# Patient Record
Sex: Female | Born: 1950 | Race: Black or African American | Hispanic: No | Smoking: Current some day smoker
Health system: Southern US, Community
[De-identification: ages and names within clinical notes are randomized; demographics above are authoritative.]

## PROBLEM LIST (undated history)

## (undated) DIAGNOSIS — M1990 Unspecified osteoarthritis, unspecified site: Secondary | ICD-10-CM

## (undated) DIAGNOSIS — N189 Chronic kidney disease, unspecified: Secondary | ICD-10-CM

## (undated) DIAGNOSIS — I509 Heart failure, unspecified: Secondary | ICD-10-CM

## (undated) DIAGNOSIS — E785 Hyperlipidemia, unspecified: Secondary | ICD-10-CM

## (undated) DIAGNOSIS — I10 Essential (primary) hypertension: Secondary | ICD-10-CM

## (undated) HISTORY — DX: Heart failure, unspecified: I50.9

## (undated) HISTORY — DX: Unspecified osteoarthritis, unspecified site: M19.90

## (undated) HISTORY — DX: Hyperlipidemia, unspecified: E78.5

## (undated) HISTORY — DX: Chronic kidney disease, unspecified: N18.9

## (undated) HISTORY — PX: TUBAL LIGATION: SHX77

## (undated) HISTORY — PX: KNEE ARTHROCENTESIS: SUR44

---

## 1989-04-06 HISTORY — PX: KNEE ARTHROCENTESIS: SUR44

## 2002-02-19 ENCOUNTER — Emergency Department (HOSPITAL_COMMUNITY): Admission: EM | Admit: 2002-02-19 | Discharge: 2002-02-19 | Payer: Self-pay | Admitting: Emergency Medicine

## 2002-02-19 ENCOUNTER — Encounter: Payer: Self-pay | Admitting: Emergency Medicine

## 2006-05-04 ENCOUNTER — Ambulatory Visit: Payer: Self-pay | Admitting: Internal Medicine

## 2006-05-05 ENCOUNTER — Ambulatory Visit: Payer: Self-pay | Admitting: *Deleted

## 2006-06-22 ENCOUNTER — Ambulatory Visit: Payer: Self-pay | Admitting: Internal Medicine

## 2006-06-22 ENCOUNTER — Encounter: Payer: Self-pay | Admitting: Internal Medicine

## 2006-07-12 ENCOUNTER — Ambulatory Visit (HOSPITAL_COMMUNITY): Admission: RE | Admit: 2006-07-12 | Discharge: 2006-07-12 | Payer: Self-pay | Admitting: Family Medicine

## 2006-09-02 ENCOUNTER — Ambulatory Visit: Payer: Self-pay | Admitting: Internal Medicine

## 2006-12-22 ENCOUNTER — Encounter (INDEPENDENT_AMBULATORY_CARE_PROVIDER_SITE_OTHER): Payer: Self-pay | Admitting: *Deleted

## 2007-02-16 ENCOUNTER — Ambulatory Visit: Payer: Self-pay | Admitting: Internal Medicine

## 2017-09-09 ENCOUNTER — Other Ambulatory Visit: Payer: Self-pay

## 2017-09-09 ENCOUNTER — Emergency Department (HOSPITAL_COMMUNITY)
Admission: EM | Admit: 2017-09-09 | Discharge: 2017-09-10 | Disposition: A | Discharge status: Home / Self Care | Payer: Self-pay | Attending: Emergency Medicine | Admitting: Emergency Medicine

## 2017-09-09 ENCOUNTER — Encounter (HOSPITAL_COMMUNITY): Payer: Self-pay | Admitting: Emergency Medicine

## 2017-09-09 DIAGNOSIS — K0889 Other specified disorders of teeth and supporting structures: Secondary | ICD-10-CM | POA: Insufficient documentation

## 2017-09-09 DIAGNOSIS — F17210 Nicotine dependence, cigarettes, uncomplicated: Secondary | ICD-10-CM | POA: Insufficient documentation

## 2017-09-09 DIAGNOSIS — I10 Essential (primary) hypertension: Secondary | ICD-10-CM | POA: Insufficient documentation

## 2017-09-09 HISTORY — DX: Essential (primary) hypertension: I10

## 2017-09-09 NOTE — ED Triage Notes (Signed)
Pt from home with c/o dental pain that has caused her to be hypertensive and have a headache. Pt states dental pain began in May. Pt went in May to have a tooth taken out, but was told they could not do so until her bp was lower. Pt began taking bp medication and antibiotics at that time. Pt states her pain medication ran out and she now feels like bp and dental pain are worse than before.

## 2017-09-10 MED ORDER — MELOXICAM 7.5 MG PO TABS: ORAL_TABLET | ORAL | 0 refills | Status: DC

## 2017-09-10 MED ORDER — AMOXICILLIN 500 MG PO CAPS: 500.0000 mg | ORAL_CAPSULE | Freq: Three times a day (TID) | ORAL | 0 refills | Status: DC

## 2017-09-10 MED ORDER — AMLODIPINE BESYLATE 5 MG PO TABS: 5.0000 mg | ORAL_TABLET | Freq: Every day | ORAL | 0 refills | Status: AC

## 2017-09-10 NOTE — ED Provider Notes (Signed)
[facility] Provider Note: [name], MD, FACEP  CSN: [id number] MRN: [id number] ARRIVAL: 09/09/17 at 2100 ROOM: WA18/WA18   CHIEF COMPLAINT  Dental Pain   HISTORY OF PRESENT ILLNESS  09/10/17 12:10 AM Dawn Davis is a 67 y.o. female who has had pain in her left lower first molar for about the past month.  She was previously placed on amoxicillin and meloxicam which helped her pain.  She went to a dentist to have definitive treatment but the dentist refused to treat her due to elevated blood pressure.  She has subsequently been placed on lisinopril.  She has been out of her amoxicillin and meloxicam for about 2 weeks.  The pain in her tooth worsened 2 days ago and she now rates it as a 9.5 out of 10, worse with eating or drinking.  She is also complaining of a headache.  She was noted to be hypertensive on arrival though she states she continues to take her lisinopril.   Past Medical History:  Diagnosis Date  . Hypertension     Past Surgical History:  Procedure Laterality Date  . KNEE ARTHROCENTESIS    . TUBAL LIGATION      No family history on file.  Social History   Tobacco Use  . Smoking status: Current Some Day Smoker  . Smokeless tobacco: Never Used  Substance Use Topics  . Alcohol use: Yes    Comment: once a week  . Drug use: Never    Prior to Admission medications   Not on File    Allergies Shellfish allergy   REVIEW OF SYSTEMS  Negative except as noted here or in the History of Present Illness.   PHYSICAL EXAMINATION  Initial Vital Signs Blood pressure (!) 185/119, pulse 86, temperature 98.4 F (36.9 C), temperature source Oral, resp. rate 16, height 5\' 1"  (1.549 m), weight 68 kg (150 lb), SpO2 98 %.  Examination General: Well-developed, well-nourished female in no acute distress; appearance consistent with age of record HENT: normocephalic; atraumatic; left lower first molar with multiple fillings and tenderness to percussion Eyes:  pupils equal, round and reactive to light; extraocular muscles intact Neck: supple; left anterior cervical lymphadenopathy Heart: regular rate and rhythm Lungs: clear to auscultation bilaterally Abdomen: soft; nondistended; nontender; bowel sounds present Extremities: No deformity; full range of motion Neurologic: Awake, alert and oriented; motor function intact in all extremities and symmetric; no facial droop Skin: Warm and dry Psychiatric: Flat affect   RESULTS  Summary of this visit's results, reviewed by myself:   EKG Interpretation  Date/Time:    Ventricular Rate:    PR Interval:    QRS Duration:   QT Interval:    QTC Calculation:   R Axis:     Text Interpretation:        Laboratory Studies: No results found for this or any previous visit (from the past 24 hour(s)). Imaging Studies: No results found.  ED COURSE and MDM  Nursing notes and initial vitals signs, including pulse oximetry, reviewed.  Vitals:   09/09/17 2147  BP: (!) 185/119  Pulse: 86  Resp: 16  Temp: 98.4 F (36.9 C)  TempSrc: Oral  SpO2: 98%  Weight: 68 kg (150 lb)  Height: 5\' 1"  (1.549 m)   We will refill the patient's amoxicillin and meloxicam.  She is already on lisinopril with poorly controlled blood pressure we will add Norvasc and have her follow back up with her dentist.  We do not have a dentist  on call from the emergency department currently.  PROCEDURES    ED DIAGNOSES     ICD-10-CM   1. Pain, dental K08.89   2. Hypertension not at goal I10        Loida Calamia, Jonny RuizJohn, MD 09/10/17 [phone]

## 2018-11-15 ENCOUNTER — Other Ambulatory Visit: Payer: Self-pay | Admitting: Family

## 2018-11-15 DIAGNOSIS — Z1231 Encounter for screening mammogram for malignant neoplasm of breast: Secondary | ICD-10-CM

## 2018-11-16 ENCOUNTER — Other Ambulatory Visit: Payer: Self-pay | Admitting: Family

## 2018-11-16 DIAGNOSIS — Z13820 Encounter for screening for osteoporosis: Secondary | ICD-10-CM

## 2019-01-23 ENCOUNTER — Ambulatory Visit
Admission: RE | Admit: 2019-01-23 | Discharge: 2019-01-23 | Disposition: A | Discharge status: Home / Self Care | Payer: Medicare Other | Source: Ambulatory Visit | Attending: Family | Admitting: Family

## 2019-01-23 ENCOUNTER — Other Ambulatory Visit: Payer: Self-pay

## 2019-01-23 DIAGNOSIS — Z1231 Encounter for screening mammogram for malignant neoplasm of breast: Secondary | ICD-10-CM

## 2019-01-23 DIAGNOSIS — Z20822 Contact with and (suspected) exposure to covid-19: Secondary | ICD-10-CM

## 2019-01-23 DIAGNOSIS — Z13820 Encounter for screening for osteoporosis: Secondary | ICD-10-CM

## 2019-01-25 LAB — NOVEL CORONAVIRUS, NAA: SARS-CoV-2, NAA: NOT DETECTED

## 2019-02-15 ENCOUNTER — Ambulatory Visit
Admission: RE | Admit: 2019-02-15 | Discharge: 2019-02-15 | Disposition: A | Discharge status: Home / Self Care | Payer: Medicare Other | Source: Ambulatory Visit | Attending: Family | Admitting: Family

## 2019-02-15 ENCOUNTER — Other Ambulatory Visit: Payer: Self-pay | Admitting: Family

## 2019-02-15 DIAGNOSIS — M25562 Pain in left knee: Secondary | ICD-10-CM

## 2019-05-18 ENCOUNTER — Other Ambulatory Visit: Payer: Self-pay | Admitting: Family

## 2019-05-18 ENCOUNTER — Other Ambulatory Visit (HOSPITAL_COMMUNITY): Payer: Self-pay | Admitting: Family

## 2019-05-18 DIAGNOSIS — M79644 Pain in right finger(s): Secondary | ICD-10-CM | POA: Diagnosis not present

## 2019-05-18 DIAGNOSIS — J309 Allergic rhinitis, unspecified: Secondary | ICD-10-CM | POA: Diagnosis not present

## 2019-05-18 DIAGNOSIS — I10 Essential (primary) hypertension: Secondary | ICD-10-CM | POA: Diagnosis not present

## 2019-05-18 DIAGNOSIS — M25562 Pain in left knee: Secondary | ICD-10-CM | POA: Diagnosis not present

## 2019-06-01 ENCOUNTER — Other Ambulatory Visit: Payer: Self-pay

## 2019-06-01 ENCOUNTER — Encounter (HOSPITAL_COMMUNITY): Payer: Self-pay

## 2019-06-01 ENCOUNTER — Ambulatory Visit (HOSPITAL_COMMUNITY)
Admission: RE | Admit: 2019-06-01 | Discharge: 2019-06-01 | Disposition: A | Discharge status: Home / Self Care | Payer: Medicare Other | Source: Ambulatory Visit | Attending: Family | Admitting: Family

## 2019-06-01 DIAGNOSIS — M25562 Pain in left knee: Secondary | ICD-10-CM

## 2019-07-03 DIAGNOSIS — Z0001 Encounter for general adult medical examination with abnormal findings: Secondary | ICD-10-CM | POA: Diagnosis not present

## 2019-07-03 DIAGNOSIS — Z79899 Other long term (current) drug therapy: Secondary | ICD-10-CM | POA: Diagnosis not present

## 2019-08-16 DIAGNOSIS — H40053 Ocular hypertension, bilateral: Secondary | ICD-10-CM | POA: Diagnosis not present

## 2019-08-16 DIAGNOSIS — H40013 Open angle with borderline findings, low risk, bilateral: Secondary | ICD-10-CM | POA: Diagnosis not present

## 2019-08-16 DIAGNOSIS — H04123 Dry eye syndrome of bilateral lacrimal glands: Secondary | ICD-10-CM | POA: Diagnosis not present

## 2019-08-16 DIAGNOSIS — H2513 Age-related nuclear cataract, bilateral: Secondary | ICD-10-CM | POA: Diagnosis not present

## 2019-10-02 DIAGNOSIS — I10 Essential (primary) hypertension: Secondary | ICD-10-CM | POA: Diagnosis not present

## 2019-10-02 DIAGNOSIS — R2681 Unsteadiness on feet: Secondary | ICD-10-CM | POA: Diagnosis not present

## 2019-10-31 DIAGNOSIS — Z1211 Encounter for screening for malignant neoplasm of colon: Secondary | ICD-10-CM | POA: Diagnosis not present

## 2021-01-10 ENCOUNTER — Other Ambulatory Visit: Payer: Self-pay | Admitting: Family

## 2021-01-10 DIAGNOSIS — Z1231 Encounter for screening mammogram for malignant neoplasm of breast: Secondary | ICD-10-CM

## 2021-03-10 ENCOUNTER — Ambulatory Visit
Admission: RE | Admit: 2021-03-10 | Discharge: 2021-03-10 | Disposition: A | Discharge status: Home / Self Care | Payer: Medicare Other | Source: Ambulatory Visit | Attending: Family | Admitting: Family

## 2021-03-10 DIAGNOSIS — Z1231 Encounter for screening mammogram for malignant neoplasm of breast: Secondary | ICD-10-CM

## 2021-11-13 IMAGING — MR MR KNEE*L* W/O CM
7 series · 39 of 40 positions shown · non-contrast
Comparison: None.

CLINICAL DATA: Left knee pain for 2 years.

EXAM:
MRI OF THE LEFT KNEE WITHOUT CONTRAST
TECHNIQUE: Multiplanar, multisequence MR imaging of the knee was performed. No
intravenous contrast was administered.

[Series 5: PD fat-sat · sagittal · left · 4.0mm · 0.47mm/px · 5 of 18 slices shown (1 of 2)]
[im 1/18]
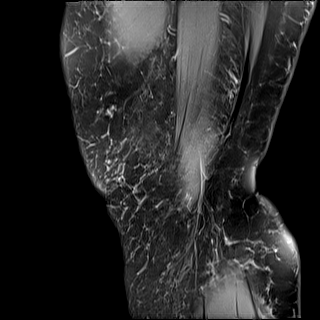
[im 5/18]
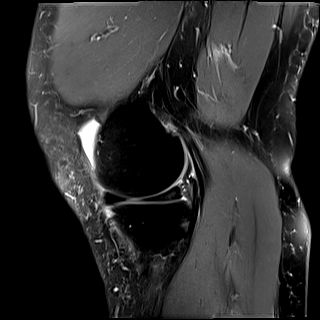
[im 9/18]
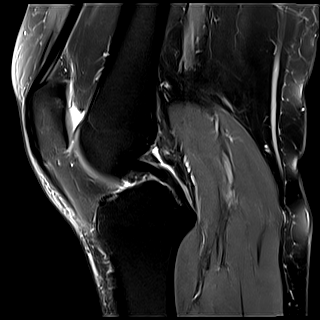
[im 13/18]
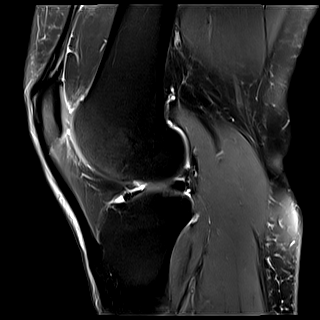
[im 18/18]
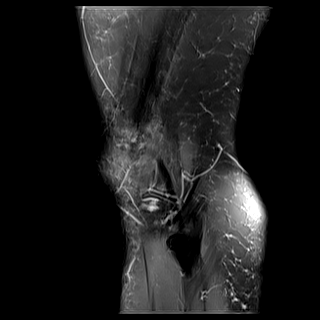

[Series 6: T2 fat-sat · sagittal · left · 4.0mm · 0.47mm/px · 5 of 18 slices shown (1 of 3)]
[im 1/18]
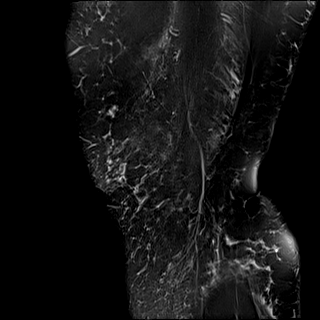
[im 5/18]
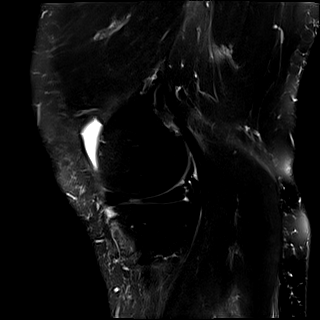
[im 9/18]
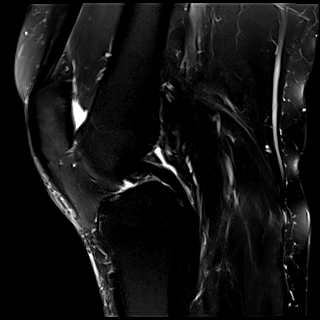
[im 13/18]
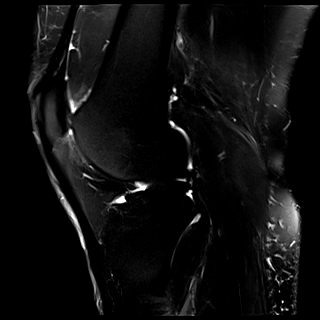
[im 18/18]
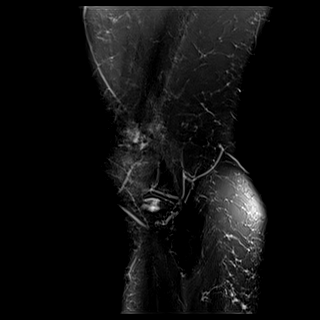

[Series 7: T1 · coronal · left · 4.0mm · 0.29mm/px · 5 of 25 slices shown]
[im 1/25]
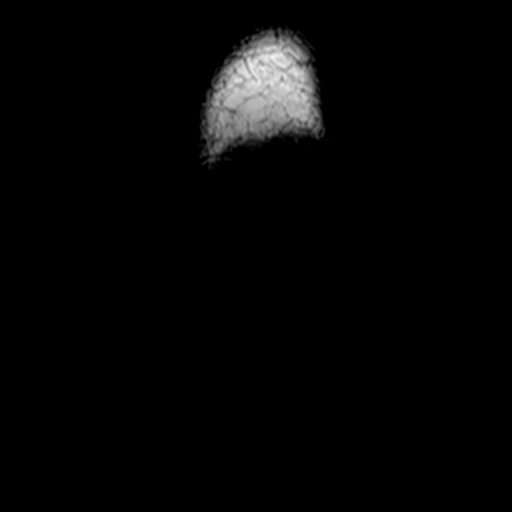
[im 5/25]
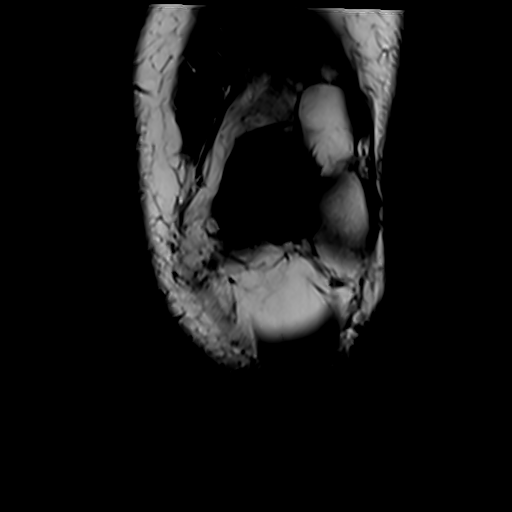
[im 10/25]
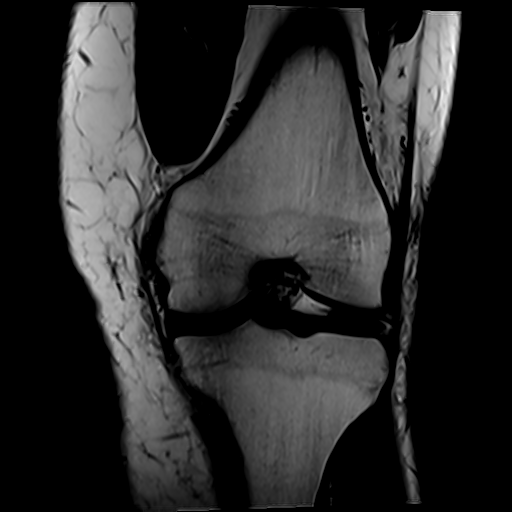
[im 15/25]
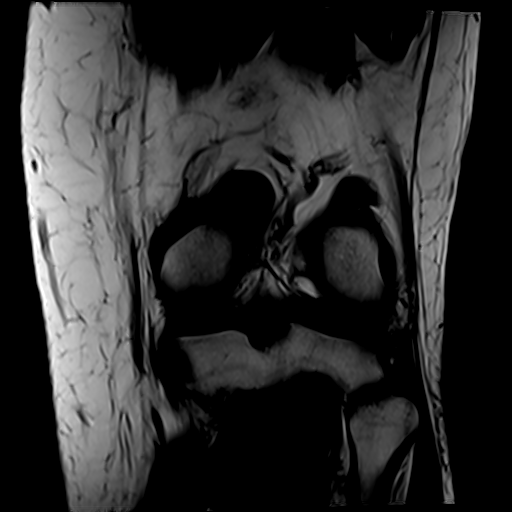
[im 20/25]
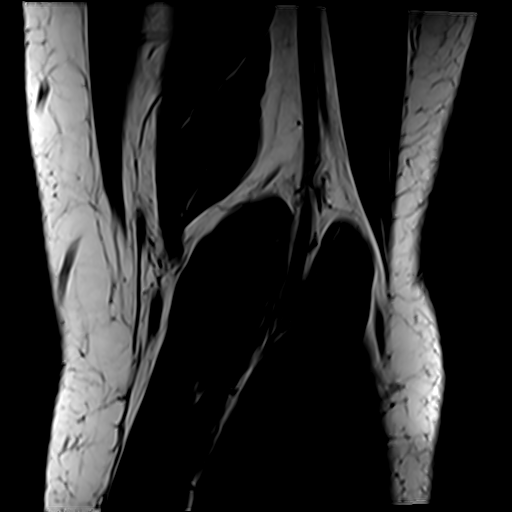

[Series 8: T2 fat-sat · coronal · left · 4.0mm · 0.59mm/px · 6 of 25 slices shown (2 of 3)]
[im 1/25]
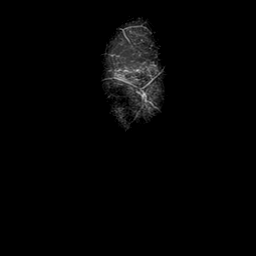
[im 5/25]
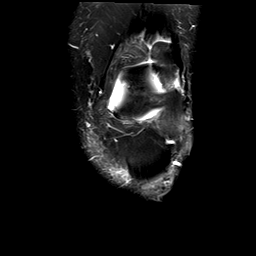
[im 10/25]
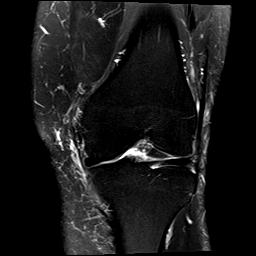
[im 15/25]
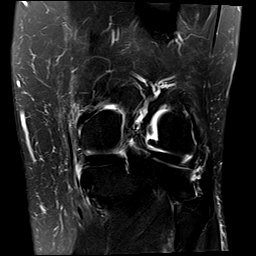
[im 20/25]
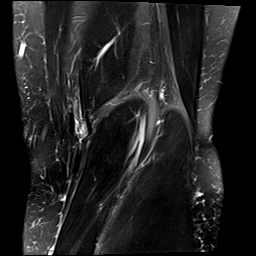
[im 25/25]
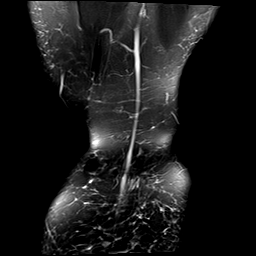

[Series 9: PD fat-sat · coronal · left · 4.0mm · 0.47mm/px · 6 of 25 slices shown (2 of 2)]
[im 1/25]
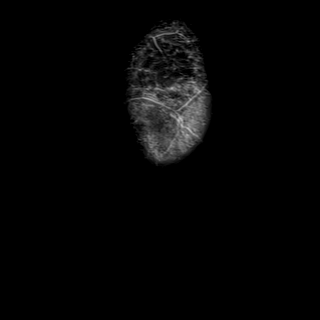
[im 5/25]
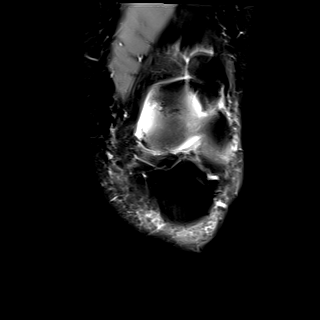
[im 10/25]
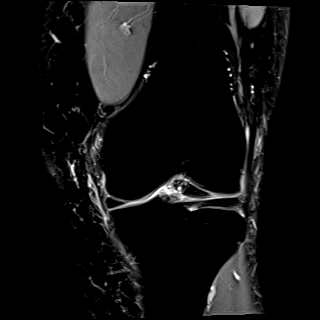
[im 15/25]
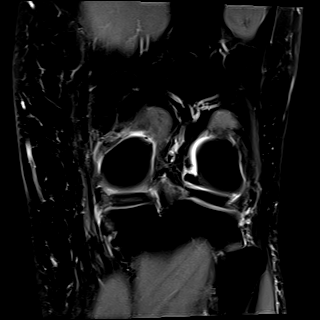
[im 20/25]
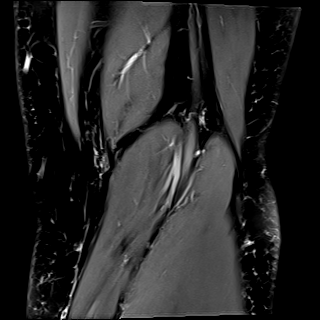
[im 25/25]
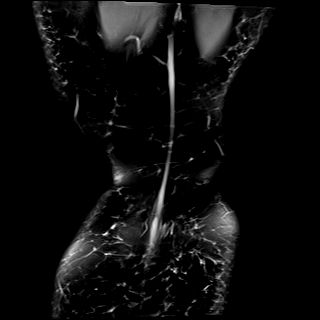

[Series 10: T2 fat-sat · axial · left · 4.0mm · 0.50mm/px · z∈[-95,+44]mm · 8 of 33 slices shown (3 of 3)]
[im 1/33]
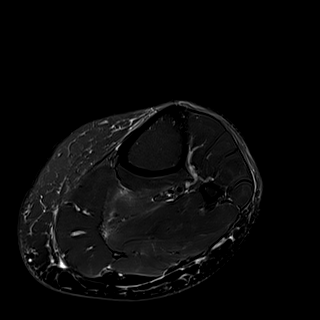
[im 5/33]
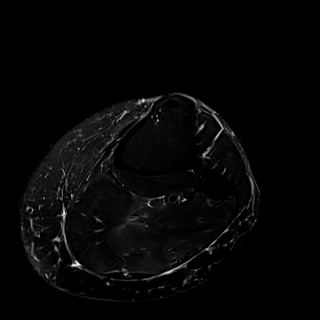
[im 10/33]
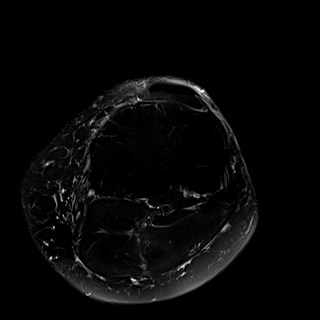
[im 14/33]
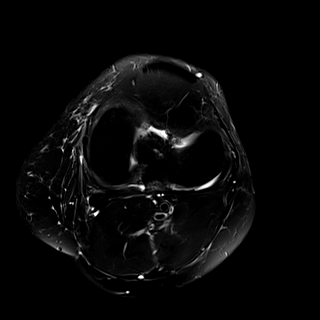
[im 19/33]
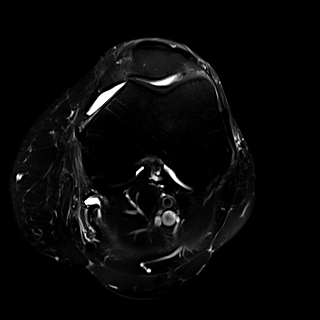
[im 23/33]
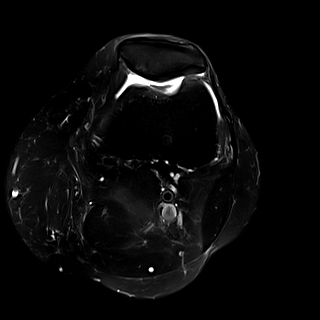
[im 28/33]
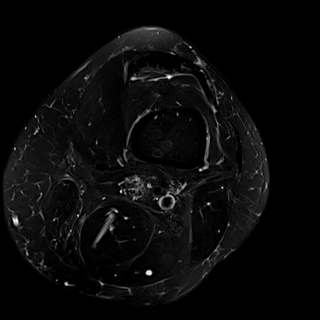
[im 33/33]
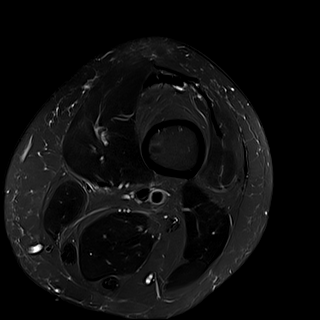

[Series 11: PD · coronal · left · 3.0mm · 0.47mm/px · 4 of 14 slices shown]
[im 1/14]
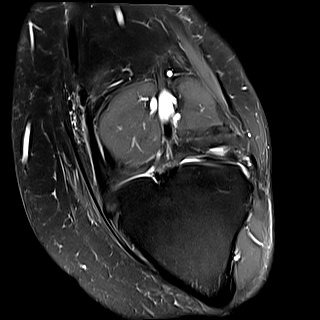
[im 5/14]
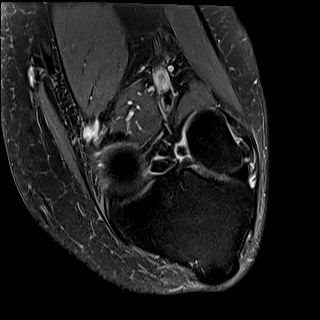
[im 9/14]
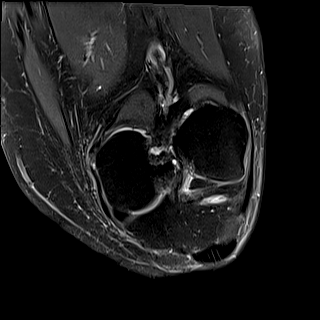
[im 14/14]
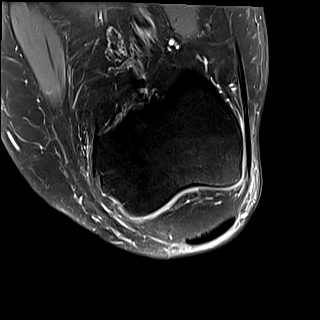

[39 of 40 positions shown; findings below may reference images not displayed]

FINDINGS: MENISCI

Medial meniscus:  Intact

Lateral meniscus:  Intact

LIGAMENTS

Cruciates:  Intact

Collaterals:  Intact

CARTILAGE

Patellofemoral: Moderate degenerative chondrosis with moderate
cartilage thinning of at least 50% involving the medial facet and
medial aspect of the patellar apex.

Medial: Mild to moderate degenerative chondrosis. No full-thickness
cartilage defect or osteochondral lesion.

Lateral:  Mild degenerative chondrosis.

Joint:  Small joint effusion.

Popliteal Fossa:  Small Baker's cyst.

Extensor Mechanism: The patella retinacular structures are intact
and the quadriceps and patellar tendons are intact.

Bones:  No acute bony findings. No bone lesions.

Other: Normal knee musculature.
IMPRESSION: 1. Intact ligamentous structures and no acute bony findings.
2. No meniscal tears.
3. Moderate degenerative chondrosis involving the medial patellar
facet and medial aspect of the patellar apex.
4. Small joint effusion and small Baker's cyst.

## 2022-05-18 ENCOUNTER — Other Ambulatory Visit: Payer: Self-pay | Admitting: Family

## 2022-05-18 DIAGNOSIS — Z1231 Encounter for screening mammogram for malignant neoplasm of breast: Secondary | ICD-10-CM

## 2022-05-20 ENCOUNTER — Ambulatory Visit
Admission: RE | Admit: 2022-05-20 | Discharge: 2022-05-20 | Disposition: A | Discharge status: Home / Self Care | Payer: 59 | Source: Ambulatory Visit | Attending: Family | Admitting: Family

## 2022-05-20 DIAGNOSIS — Z1231 Encounter for screening mammogram for malignant neoplasm of breast: Secondary | ICD-10-CM

## 2023-05-03 ENCOUNTER — Encounter: Payer: Self-pay | Admitting: Gastroenterology

## 2023-05-03 ENCOUNTER — Other Ambulatory Visit: Payer: Self-pay | Admitting: Family

## 2023-05-03 DIAGNOSIS — Z1231 Encounter for screening mammogram for malignant neoplasm of breast: Secondary | ICD-10-CM

## 2023-05-25 ENCOUNTER — Ambulatory Visit
Admission: RE | Admit: 2023-05-25 | Discharge: 2023-05-25 | Disposition: A | Discharge status: Home / Self Care | Payer: 59 | Source: Ambulatory Visit | Attending: Family

## 2023-05-25 DIAGNOSIS — Z1231 Encounter for screening mammogram for malignant neoplasm of breast: Secondary | ICD-10-CM

## 2023-06-07 ENCOUNTER — Telehealth: Payer: Self-pay

## 2023-06-07 ENCOUNTER — Ambulatory Visit (AMBULATORY_SURGERY_CENTER): Payer: 59

## 2023-06-07 VITALS — Ht 61.5 in | Wt 157.0 lb

## 2023-06-07 DIAGNOSIS — Z1211 Encounter for screening for malignant neoplasm of colon: Secondary | ICD-10-CM

## 2023-06-07 MED ORDER — SUFLAVE 178.7 G PO SOLR: 1.0000 | Freq: Once | ORAL | 0 refills | Status: AC

## 2023-06-07 NOTE — Progress Notes (Signed)
 No egg or soy allergy known to patient  No issues known to pt with past sedation with any surgeries or procedures Patient denies ever being told they had issues or difficulty with intubation  No FH of Malignant Hyperthermia Pt is not on diet pills Pt is not on home 02  Pt is not on blood thinners  Pt denies issues with chronic constipation  No A fib or A flutter Have any cardiac testing pending--no Ambulates independently

## 2023-06-07 NOTE — Telephone Encounter (Signed)
 During the course of completing patient pre-visit, patient stated that during a knee surgery in 1991, her heart stopped. Patient had to be rushed to the hospital and was in ICU for 7 days.   RN told patient that though we do not use general anesthesia at [facility] as she would have had for the knee surgery, RN will inform the MD and CRNA of this episode in the event that a decision is made to perform the procedure at the hospital.  RN told patient that we will let her know if the details of her procedure need to change.

## 2023-06-07 NOTE — Telephone Encounter (Signed)
Please see previous notes.

## 2023-06-07 NOTE — Telephone Encounter (Signed)
 Will wait for John's response RG

## 2023-06-08 ENCOUNTER — Encounter: Payer: Self-pay | Admitting: *Deleted

## 2023-06-08 NOTE — Telephone Encounter (Signed)
 Copy given to [facility] and a copy sent off to be scanned

## 2023-06-08 NOTE — Telephone Encounter (Signed)
 Spoke with pt and explained Dawn Davis's recommendations. She states she had an ECHO within the past year with Fatima Sanger, FNP.  She does not have the results of that.  Viviann Spare or Nehemiah Settle,  Could you help with getting a copy of that ECHO please?  Thanks, [employer]

## 2023-06-08 NOTE — Telephone Encounter (Signed)
 Called them and spoke to Dawn Davis and she took the information down and said that she will send it over to MR to see

## 2023-06-08 NOTE — Telephone Encounter (Signed)
 Dr. Chales Abrahams and Jonny Ruiz,  ECHO from [place] [facility] office scanned under media tab from today for review.  Please advise.  Thanks, [employer]

## 2023-07-12 ENCOUNTER — Encounter: Payer: Self-pay | Admitting: Gastroenterology

## 2023-07-12 ENCOUNTER — Ambulatory Visit (AMBULATORY_SURGERY_CENTER): Payer: 59 | Admitting: Gastroenterology

## 2023-07-12 VITALS — BP 145/91 | HR 64 | Temp 97.6°F | Resp 10 | Ht 61.5 in | Wt 157.0 lb

## 2023-07-12 DIAGNOSIS — K5730 Diverticulosis of large intestine without perforation or abscess without bleeding: Secondary | ICD-10-CM

## 2023-07-12 DIAGNOSIS — D122 Benign neoplasm of ascending colon: Secondary | ICD-10-CM | POA: Diagnosis not present

## 2023-07-12 DIAGNOSIS — Z1211 Encounter for screening for malignant neoplasm of colon: Secondary | ICD-10-CM

## 2023-07-12 DIAGNOSIS — K640 First degree hemorrhoids: Secondary | ICD-10-CM

## 2023-07-12 MED ORDER — SODIUM CHLORIDE 0.9 % IV SOLN: 500.0000 mL | INTRAVENOUS | Status: DC

## 2023-07-12 NOTE — Progress Notes (Signed)
 Pt A/O x 3, gd SR's, pleased with anesthesia, report to RN

## 2023-07-12 NOTE — Op Note (Signed)
 [facility] Patient Name: Dawn Davis Procedure Date: 07/12/2023 3:34 PM MRN: [id number] Endoscopist: Lynann Bologna , MD, [id number] Age: 73 Referring MD:  Date of Birth: 02/05/1951 Gender: Female Account #: [id number] Procedure:                Colonoscopy Indications:              Screening for colorectal malignant neoplasm Medicines:                Monitored Anesthesia Care Procedure:                Pre-Anesthesia Assessment:                           - Prior to the procedure, a History and Physical                            was performed, and patient medications and                            allergies were reviewed. The patient's tolerance of                            previous anesthesia was also reviewed. The risks                            and benefits of the procedure and the sedation                            options and risks were discussed with the patient.                            All questions were answered, and informed consent                            was obtained. Prior Anticoagulants: The patient has                            taken no anticoagulant or antiplatelet agents. ASA                            Grade Assessment: III - A patient with severe                            systemic disease. After reviewing the risks and                            benefits, the patient was deemed in satisfactory                            condition to undergo the procedure.                           After obtaining informed consent, the colonoscope  was passed under direct vision. Throughout the                            procedure, the patient's blood pressure, pulse, and                            oxygen saturations were monitored continuously. The                            Olympus Scope PCF SN [id number] was introduced through                            the anus and advanced to the the cecum, identified                            by  appendiceal orifice and ileocecal valve. The                            colonoscopy was performed without difficulty. The                            patient tolerated the procedure well. The quality                            of the bowel preparation was good. The ileocecal                            valve, appendiceal orifice, and rectum were                            photographed. Scope In: 3:47:15 PM Scope Out: 4:02:52 PM Scope Withdrawal Time: 0 hours 12 minutes 48 seconds  Total Procedure Duration: 0 hours 15 minutes 37 seconds  Findings:                 A 30 mm polyp was found in the proximal ascending                            colon. The polyp was sessile. The polyp was removed                            with a hot snare. Resection and retrieval were                            complete. It was retrieved via Lucina Mellow net. Estimated                            blood loss: none.                           Multiple medium-mouthed diverticula were found in                            the sigmoid colon, descending colon, ascending  colon and cecum.                           Non-bleeding internal hemorrhoids were found during                            retroflexion. The hemorrhoids were small and Grade                            I (internal hemorrhoids that do not prolapse).                           The exam was otherwise without abnormality on                            direct and retroflexion views. Complications:            No immediate complications. Estimated Blood Loss:     Estimated blood loss: none. Impression:               - One 30 mm polyp in the proximal ascending colon,                            removed with a hot snare. Resected and retrieved.                           - Diverticulosis in the sigmoid colon, in the                            descending colon, in the ascending colon and in the                            cecum.                            - Non-bleeding internal hemorrhoids.                           - The examination was otherwise normal on direct                            and retroflexion views. Recommendation:           - Patient has a contact number available for                            emergencies. The signs and symptoms of potential                            delayed complications were discussed with the                            patient. Return to normal activities tomorrow.                            Written discharge instructions were provided to the  patient.                           - Resume previous diet.                           - Continue present medications.                           - No aspirin, ibuprofen, naproxen, or other                            non-steroidal anti-inflammatory drugs for 5 days                            after polyp removal.                           - Await pathology results.                           - Repeat colonoscopy in 1 year for surveillance                            based on pathology results.                           - The findings and recommendations were discussed                            with the patient's family. Lynann Bologna, MD 07/12/2023 4:11:17 PM This report has been signed electronically.

## 2023-07-12 NOTE — Progress Notes (Signed)
 VS by DT  Pt's states no medical or surgical changes since previsit or office visit.

## 2023-07-12 NOTE — Progress Notes (Signed)
 Called to room to assist during endoscopic procedure.  Patient ID and intended procedure confirmed with present staff. Received instructions for my participation in the procedure from the performing physician.

## 2023-07-12 NOTE — Progress Notes (Signed)
 [facility] History and Physical   Primary Care Physician:  Raymon Mutton., FNP   Reason for Procedure:   CRC screening  Plan:    colon     HPI: Dawn Davis is a 73 y.o. female    Past Medical History:  Diagnosis Date   Arthritis    CHF (congestive heart failure) (HCC)    Chronic kidney disease    Hyperlipidemia    Hypertension     Past Surgical History:  Procedure Laterality Date   KNEE ARTHROCENTESIS  1991   TUBAL LIGATION      Prior to Admission medications   Medication Sig Start Date End Date Taking? Authorizing Provider  losartan (COZAAR) 100 MG tablet Take 100 mg by mouth daily. 04/20/23  Yes [provider]  rosuvastatin (CRESTOR) 20 MG tablet Take 20 mg by mouth at bedtime. 04/20/23  Yes [provider]  amLODipine (NORVASC) 5 MG tablet Take 1 tablet (5 mg total) by mouth daily. Patient not taking: Reported on 07/12/2023 09/10/17   Molpus, Jonny Ruiz, MD  cetirizine (ZYRTEC) 10 MG chewable tablet Chew 10 mg by mouth daily as needed for allergies.    [provider]  fluticasone (FLONASE) 50 MCG/ACT nasal spray Place 2 sprays into both nostrils daily as needed. 04/20/23   [provider]  meloxicam (MOBIC) 15 MG tablet Take 15 mg by mouth daily. 04/20/23   [provider]    Current Outpatient Medications  Medication Sig Dispense Refill   losartan (COZAAR) 100 MG tablet Take 100 mg by mouth daily.     rosuvastatin (CRESTOR) 20 MG tablet Take 20 mg by mouth at bedtime.     amLODipine (NORVASC) 5 MG tablet Take 1 tablet (5 mg total) by mouth daily. (Patient not taking: Reported on 07/12/2023) 30 tablet 0   cetirizine (ZYRTEC) 10 MG chewable tablet Chew 10 mg by mouth daily as needed for allergies.     fluticasone (FLONASE) 50 MCG/ACT nasal spray Place 2 sprays into both nostrils daily as needed.     meloxicam (MOBIC) 15 MG tablet Take 15 mg by mouth daily.     Current Facility-Administered Medications   Medication Dose Route Frequency Provider Last Rate Last Admin   0.9 %  sodium chloride infusion  500 mL Intravenous Continuous Lynann Bologna, MD        Allergies as of 07/12/2023 - Review Complete 07/12/2023  Allergen Reaction Noted   Shellfish allergy Anaphylaxis 09/09/2017    Family History  Problem Relation Age of Onset   Colon cancer Neg Hx    Esophageal cancer Neg Hx    Rectal cancer Neg Hx    Stomach cancer Neg Hx    Colon polyps Neg Hx     Social History   Socioeconomic History   Marital status: Divorced    Spouse name: Not on file   Number of children: Not on file   Years of education: Not on file   Highest education level: Not on file  Occupational History   Not on file  Tobacco Use   Smoking status: Some Days   Smokeless tobacco: Never  Vaping Use   Vaping status: Never Used  Substance and Sexual Activity   Alcohol use: Yes    Alcohol/week: 5.0 standard drinks of alcohol    Types: 5 Cans of beer per week   Drug use: Never   Sexual activity: Not on file  Other Topics Concern   Not on file  Social History  Narrative   Not on file   Social Drivers of Health   Financial Resource Strain: Not on file  Food Insecurity: Not on file  Transportation Needs: Not on file  Physical Activity: Not on file  Stress: Not on file  Social Connections: Not on file  Intimate Partner Violence: Not on file    Review of Systems: Positive for none All other review of systems negative except as mentioned in the HPI.  Physical Exam: Vital signs in last 24 hours: @VSRANGES @   General:   Alert,  Well-developed, well-nourished, pleasant and cooperative in NAD Lungs:  Clear throughout to auscultation.   Heart:  Regular rate and rhythm; no murmurs, clicks, rubs,  or gallops. Abdomen:  Soft, nontender and nondistended. Normal bowel sounds.   Neuro/Psych:  Alert and cooperative. Normal mood and affect. A and O x 3    No significant changes were identified.  The patient  continues to be an appropriate candidate for the planned procedure and anesthesia.   Edman Circle, MD. [facility] 07/12/2023 3:42 PM@

## 2023-07-12 NOTE — Patient Instructions (Signed)
Handouts given on polyps, diverticulosis and hemorrhoids.  YOU HAD AN ENDOSCOPIC PROCEDURE TODAY AT [facility]:   Refer to the procedure report that was given to you for any specific questions about what was found during the examination.  If the procedure report does not answer your questions, please call your gastroenterologist to clarify.  If you requested that your care partner not be given the details of your procedure findings, then the procedure report has been included in a sealed envelope for you to review at your convenience later.  YOU SHOULD EXPECT: Some feelings of bloating in the abdomen. Passage of more gas than usual.  Walking can help get rid of the air that was put into your GI tract during the procedure and reduce the bloating. If you had a lower endoscopy (such as a colonoscopy or flexible sigmoidoscopy) you may notice spotting of blood in your stool or on the toilet paper. If you underwent a bowel prep for your procedure, you may not have a normal bowel movement for a few days.  Please Note:  You might notice some irritation and congestion in your nose or some drainage.  This is from the oxygen used during your procedure.  There is no need for concern and it should clear up in a day or so.  SYMPTOMS TO REPORT IMMEDIATELY:  Following lower endoscopy (colonoscopy or flexible sigmoidoscopy):  Excessive amounts of blood in the stool  Significant tenderness or worsening of abdominal pains  Swelling of the abdomen that is new, acute  Fever of 100F or higher  For urgent or emergent issues, a gastroenterologist can be reached at any hour by calling [phone]. Do not use MyChart messaging for urgent concerns.    DIET:  We do recommend a small meal at first, but then you may proceed to your regular diet.  Drink plenty of fluids but you should avoid alcoholic beverages for 24 hours.  ACTIVITY:  You should plan to take it easy for the rest of today and you should  NOT DRIVE or use heavy machinery until tomorrow (because of the sedation medicines used during the test).    FOLLOW UP: Our staff will call the number listed on your records the next business day following your procedure.  We will call around 7:15- 8:00 am to check on you and address any questions or concerns that you may have regarding the information given to you following your procedure. If we do not reach you, we will leave a message.     If any biopsies were taken you will be contacted by phone or by letter within the next 1-3 weeks.  Please call us at [phone] if you have not heard about the biopsies in 3 weeks.    SIGNATURES/CONFIDENTIALITY: You and/or your care partner have signed paperwork which will be entered into your electronic medical record.  These signatures attest to the fact that that the information above on your After Visit Summary has been reviewed and is understood.  Full responsibility of the confidentiality of this discharge information lies with you and/or your care-partner.

## 2023-07-13 ENCOUNTER — Telehealth: Payer: Self-pay

## 2023-07-13 NOTE — Telephone Encounter (Signed)
  Follow up Call-     07/12/2023    3:09 PM  Call back number  Post procedure Call Back phone  # [phone]  Permission to leave phone message Yes     Patient questions:  Do you have a fever, pain , or abdominal swelling? No. Pain Score  0 *  Have you tolerated food without any problems? Yes.    Have you been able to return to your normal activities? Yes.    Do you have any questions about your discharge instructions: Diet   No. Medications  No. Follow up visit  No.  Do you have questions or concerns about your Care? No.  Actions: * If pain score is 4 or above: No action needed, pain <4.

## 2023-07-15 LAB — SURGICAL PATHOLOGY

## 2023-07-22 ENCOUNTER — Encounter: Payer: Self-pay | Admitting: Gastroenterology
